# Patient Record
Sex: Female | Born: 1995 | Race: Black or African American | Hispanic: No | Marital: Single | Smoking: Never smoker
Health system: Southern US, Community
[De-identification: ages and names within clinical notes are randomized; demographics above are authoritative.]

---
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
## 2018-12-18 ENCOUNTER — Other Ambulatory Visit: Payer: Self-pay

## 2018-12-18 ENCOUNTER — Encounter: Payer: Self-pay | Admitting: Family Medicine

## 2018-12-18 ENCOUNTER — Ambulatory Visit (HOSPITAL_COMMUNITY)
Admission: RE | Admit: 2018-12-18 | Discharge: 2018-12-18 | Disposition: A | Discharge status: Home / Self Care | Payer: Managed Care, Other (non HMO) | Source: Ambulatory Visit | Attending: Family Medicine | Admitting: Family Medicine

## 2018-12-18 ENCOUNTER — Ambulatory Visit (INDEPENDENT_AMBULATORY_CARE_PROVIDER_SITE_OTHER): Payer: Managed Care, Other (non HMO) | Admitting: Family Medicine

## 2018-12-18 ENCOUNTER — Other Ambulatory Visit: Payer: Self-pay | Admitting: Family Medicine

## 2018-12-18 VITALS — BP 98/60 | Temp 97.5°F | Ht 66.0 in | Wt 133.0 lb

## 2018-12-18 DIAGNOSIS — M4120 Other idiopathic scoliosis, site unspecified: Secondary | ICD-10-CM | POA: Insufficient documentation

## 2018-12-18 DIAGNOSIS — M546 Pain in thoracic spine: Secondary | ICD-10-CM | POA: Insufficient documentation

## 2018-12-18 DIAGNOSIS — G8929 Other chronic pain: Secondary | ICD-10-CM

## 2018-12-18 DIAGNOSIS — Z23 Encounter for immunization: Secondary | ICD-10-CM | POA: Diagnosis not present

## 2018-12-18 DIAGNOSIS — Z0000 Encounter for general adult medical examination without abnormal findings: Secondary | ICD-10-CM | POA: Diagnosis not present

## 2018-12-18 DIAGNOSIS — Z114 Encounter for screening for human immunodeficiency virus [HIV]: Secondary | ICD-10-CM | POA: Diagnosis not present

## 2018-12-18 MED ORDER — IBUPROFEN 400 MG PO TABS: 400.0000 mg | ORAL_TABLET | Freq: Three times a day (TID) | ORAL | 0 refills | Status: DC | PRN

## 2018-12-18 NOTE — Patient Instructions (Signed)

## 2018-12-18 NOTE — Progress Notes (Addendum)
Patient ID: Regina Aguirre, female   DOB: [birth year], 22 y.o.   MRN: [id number] Subjective:     Regina BassetRafa H Chevalier is a 22 y.o. female and is here for a comprehensive physical exam. The patient reports problems - back pain x 2 months and left shoulder pain x 2 weeks.. Back Pain: mostly upper back, worse in the morning. Feels like it is related to school work. 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 No trauma or injury to her back or shoulder. Left shoulder pain: Started two weeks ago. Denies any injury. Denies heavy weight lifting.  LMP: 4th week in Nov. Regular.  Sex: Denies sexual activity.  Social History   Socioeconomic History  . Marital status: Single    Spouse name: Not on file  . Number of children: Not on file  . Years of education: Not on file  . Highest education level: Not on file  Occupational History  . Not on file  Social Needs  . Financial resource strain: Not on file  . Food insecurity:    Worry: Not on file    Inability: Not on file  . Transportation needs:    Medical: Not on file    Non-medical: Not on file  Tobacco Use  . Smoking status: Never Smoker  . Smokeless tobacco: Never Used  Substance and Sexual Activity  . Alcohol use: Not on file  . Drug use: Not on file  . Sexual activity: Not on file  Lifestyle  . Physical activity:    Days per week: Not on file    Minutes per session: Not on file  . Stress: Not on file  Relationships  . Social connections:    Talks on phone: Not on file    Gets together: Not on file    Attends religious service: Not on file    Active member of club or organization: Not on file    Attends meetings of clubs or organizations: Not on file    Relationship status: Not on file  . Intimate partner violence:    Fear of current or ex partner: Not on file    Emotionally abused: Not on file    Physically abused: Not on file    Forced sexual activity: Not on file  Other Topics  Concern  . Not on file  Social History Narrative   Will be going to [employer] 9th grade in the fall   Health Maintenance  Topic Date Due  . HIV Screening  07/25/2013  . INFLUENZA VACCINE  07/27/2018  . TETANUS/TDAP  08/09/2019    The following portions of the patient's history were reviewed and updated as appropriate: allergies, current medications, past family history, past medical history, past social history, past surgical history and problem list.  Review of Systems Pertinent items noted in HPI and remainder of comprehensive ROS otherwise negative.   Objective:    BP 98/60   Temp (!) 97.5 F (36.4 C) (Oral)   Ht 5\' 6"  (1.676 m)   Wt 133 lb (60.3 kg)   LMP  (LMP Unknown)   BMI 21.47 kg/m  General appearance: alert, cooperative and appears older than stated age Head: Normocephalic, without obvious abnormality, atraumatic Eyes: conjunctivae/corneas clear. PERRL, EOM's intact. Fundi benign. Ears: normal TM's and external ear canals both ears Throat: lips, mucosa, and tongue normal; teeth and gums normal Neck: no adenopathy, no carotid bruit, no JVD, supple, symmetrical, trachea midline  and thyroid not enlarged, symmetric, no tenderness/mass/nodules Back: Asymmetrical upper back. No tenderness Lungs: clear to auscultation bilaterally Heart: regular rate and rhythm, S1, S2 normal, no murmur, click, rub or gallop Abdomen: soft, non-tender; bowel sounds normal; no masses,  no organomegaly Extremities: extremities normal, atraumatic, no cyanosis or edema Pulses: 2+ and symmetric Skin: Skin color, texture, turgor normal. No rashes or lesions Lymph nodes: Cervical, supraclavicular, and axillary nodes normal. Neurologic: Alert and oriented X 3, normal strength and tone. Normal symmetric reflexes. Normal coordination and gait      [facility]  PHQ-9 Total Score  3     Assessment:    Healthy female exam. Back pain. Left  shoulder pain     Plan:    + Scoliosis on exam.  Vaccination updated. Not currently sexually active. Birth control not indicated. HIV screening completed. Xray ordered to assess severity of her scoliosis. Ibuprofen prescribed prn pain. F/U soon if symptoms worsen.

## 2018-12-18 NOTE — Progress Notes (Signed)
Patient ID: Regina Aguirre, female   DOB: 09/21/1996, 22 y.o.   MRN: [id number] Xray report discussed with her via phone call. She has midl scoliosis. I recommended back brace which she will obtain OTC. She requested Chiropractor referral. I don't think they require referral for this. She will call to schedule her own Chiro appointment.  Dg Thoracic Spine 2 View  Result Date: 12/18/2018 CLINICAL DATA:  Chronic back pain EXAM: THORACIC SPINE 2 VIEWS COMPARISON:  None. FINDINGS: Twelve rib pairs. 9 degree dextroscoliosis of the thoracic spine. No congenital vertebral anomalies. Normal vertebral body heights. IMPRESSION: No acute osseous abnormality.  Mild thoracic scoliosis Electronically Signed   By: Jasmine PangKim  Fujinaga M.D.   On: 12/18/2018 16:48   Dg Lumbar Spine Complete  Result Date: 12/18/2018 CLINICAL DATA:  Chronic back pain EXAM: LUMBAR SPINE - COMPLETE 4+ VIEW COMPARISON:  None. FINDINGS: Lumbar vertebra demonstrate normal stature. There is levoscoliosis of the thoracolumbar spine measuring approximately 17 degrees. No congenital vertebral anomalies. IMPRESSION: No acute osseous abnormality.  Scoliosis of the thoracolumbar spine Electronically Signed   By: Jasmine PangKim  Fujinaga M.D.   On: 12/18/2018 16:49

## 2018-12-19 LAB — HIV ANTIBODY (ROUTINE TESTING W REFLEX): HIV Screen 4th Generation wRfx: NONREACTIVE

## 2020-01-16 IMAGING — CR DG THORACIC SPINE 2V
3 series · 3 of 3 positions shown · non-contrast
Comparison: None.

CLINICAL DATA: Chronic back pain

EXAM:
THORACIC SPINE 2 VIEWS

[t-spine ap]
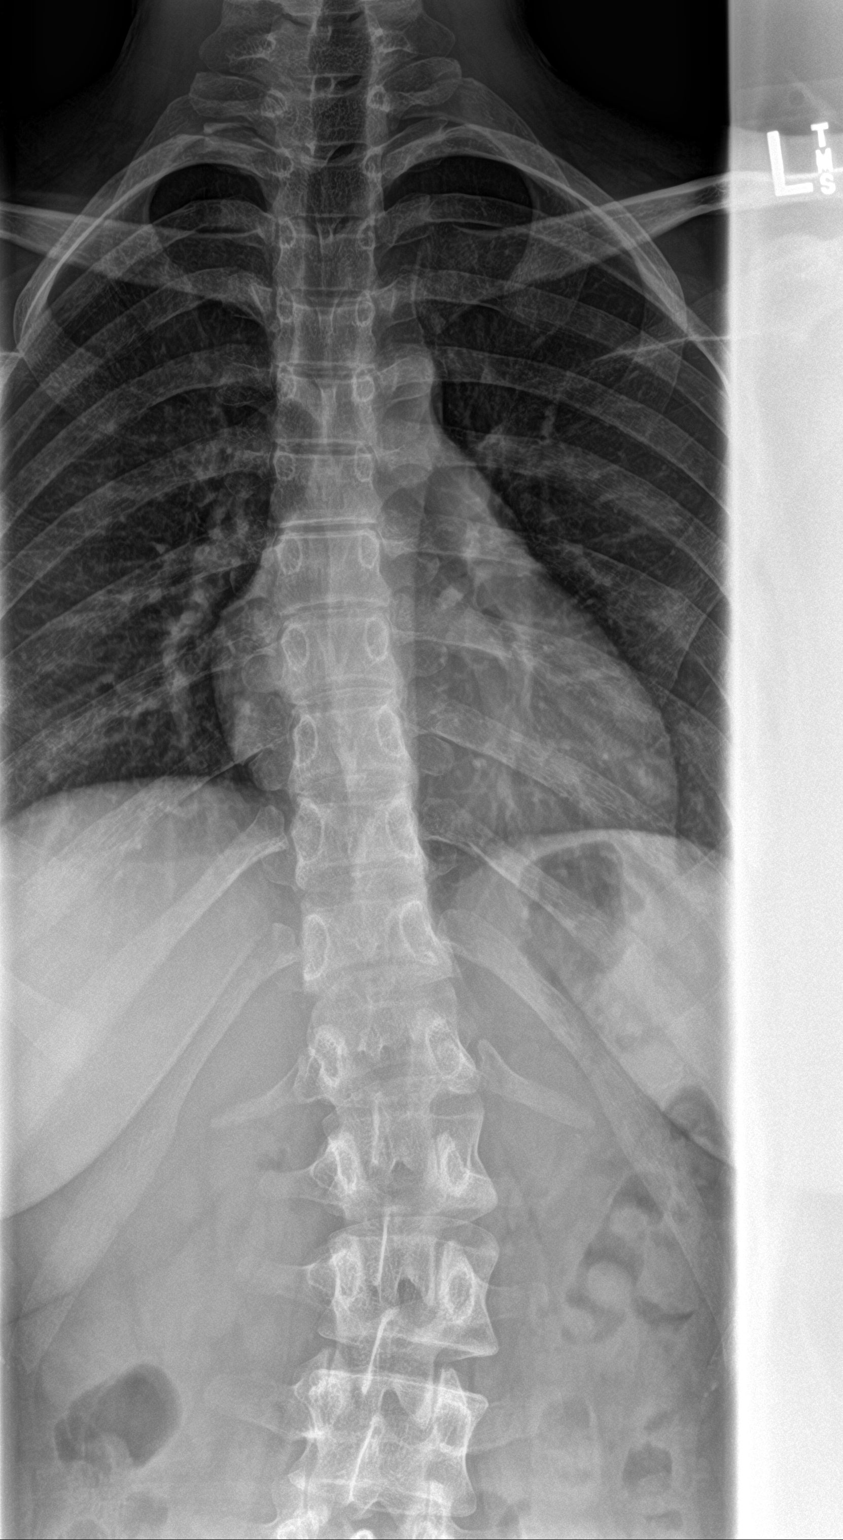

[t-spine lat]
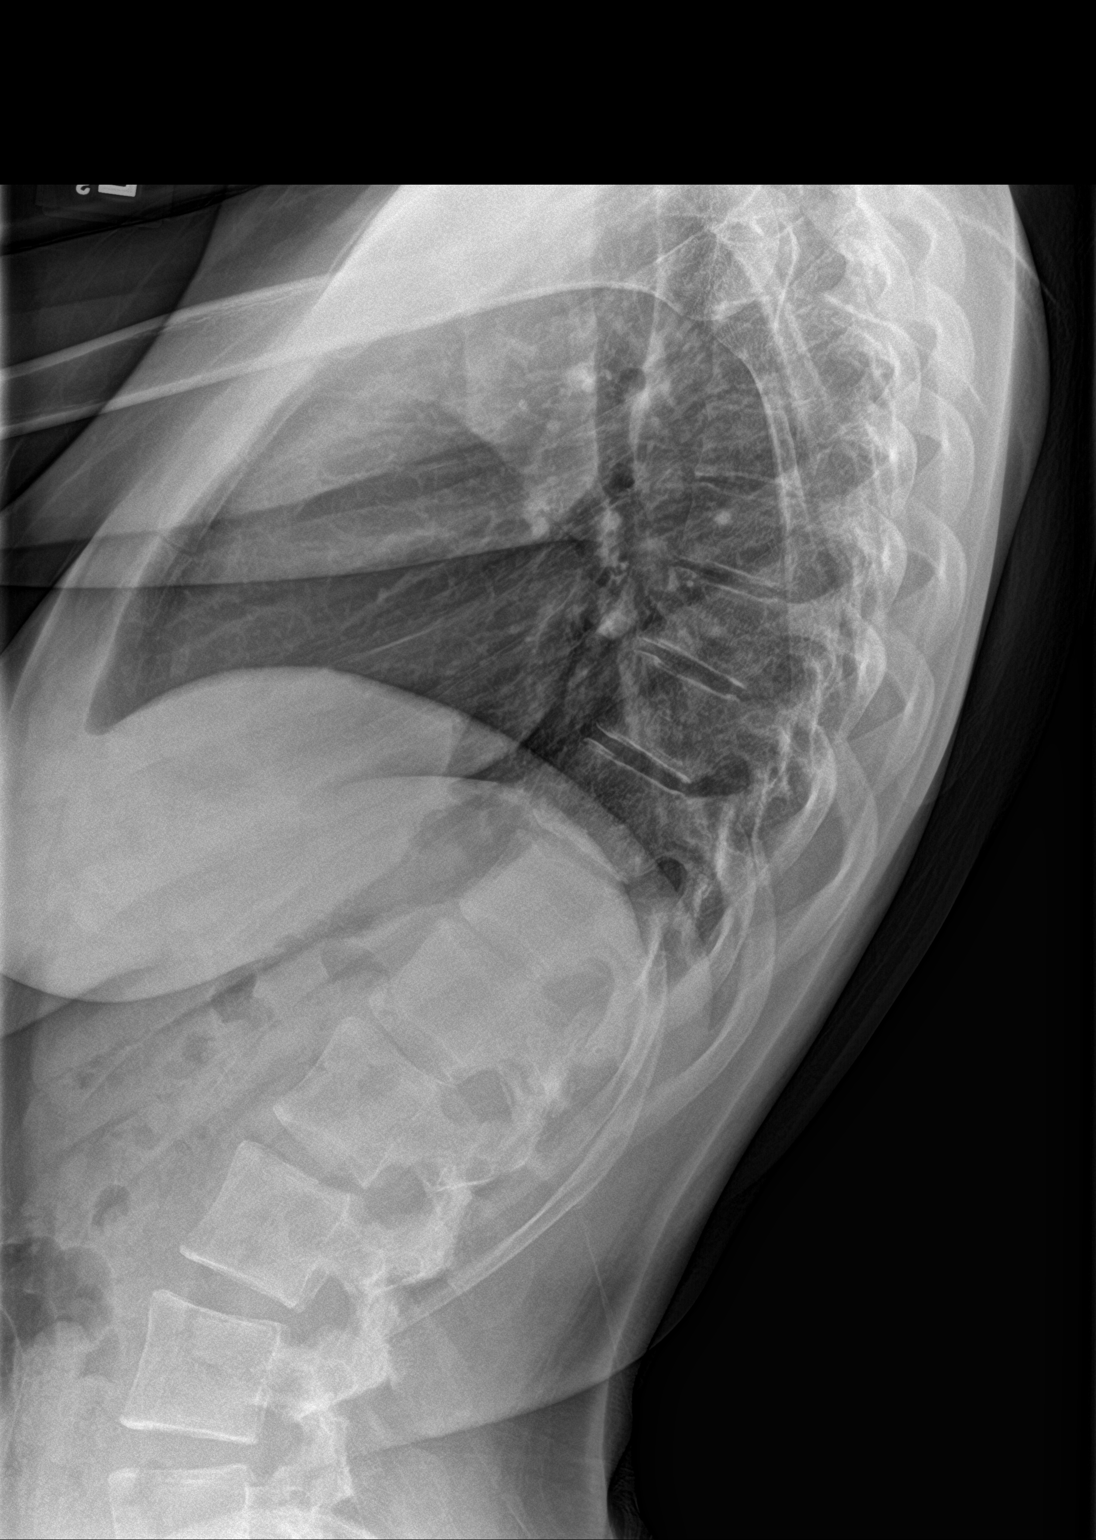

[t-spine swimmers]
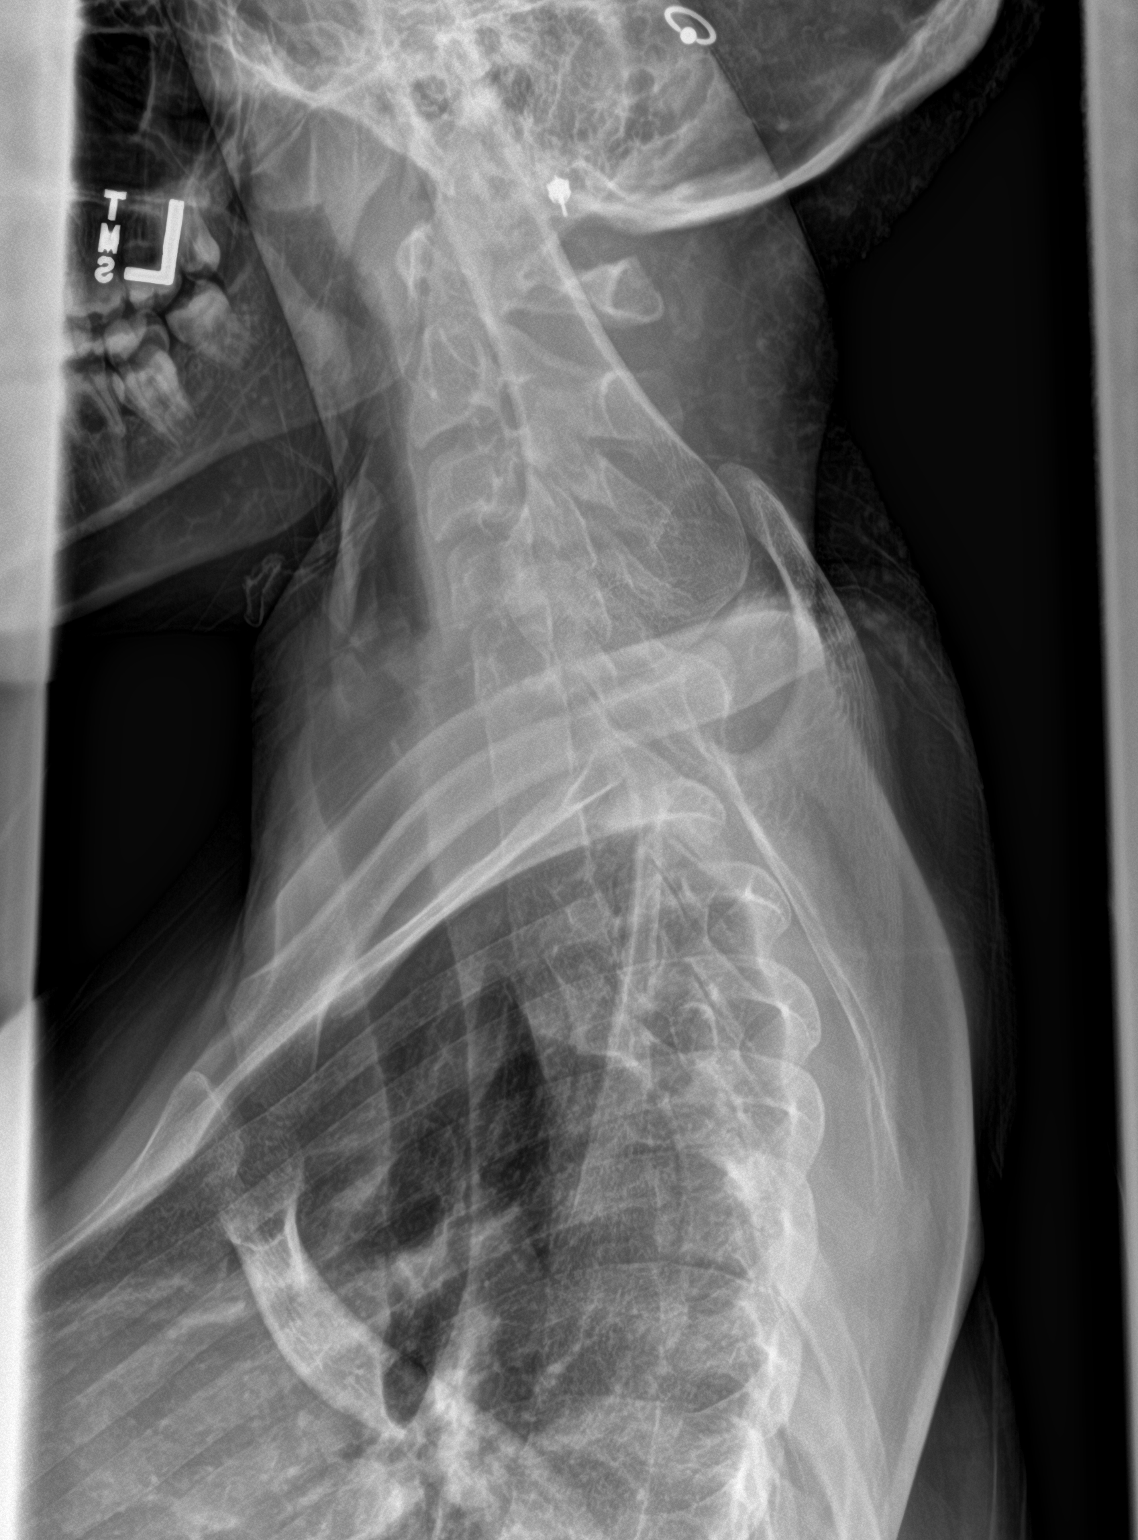

[3 of 3 positions shown; findings below may reference images not displayed]

FINDINGS: Twelve rib pairs. 9 degree dextroscoliosis of the thoracic spine. No
congenital vertebral anomalies. Normal vertebral body heights.
IMPRESSION: No acute osseous abnormality.  Mild thoracic scoliosis

## 2020-01-16 IMAGING — CR DG LUMBAR SPINE COMPLETE 4+V
5 series · 5 of 5 positions shown · non-contrast
Comparison: None.

CLINICAL DATA: Chronic back pain

EXAM:
LUMBAR SPINE - COMPLETE 4+ VIEW

[l-spine ap]
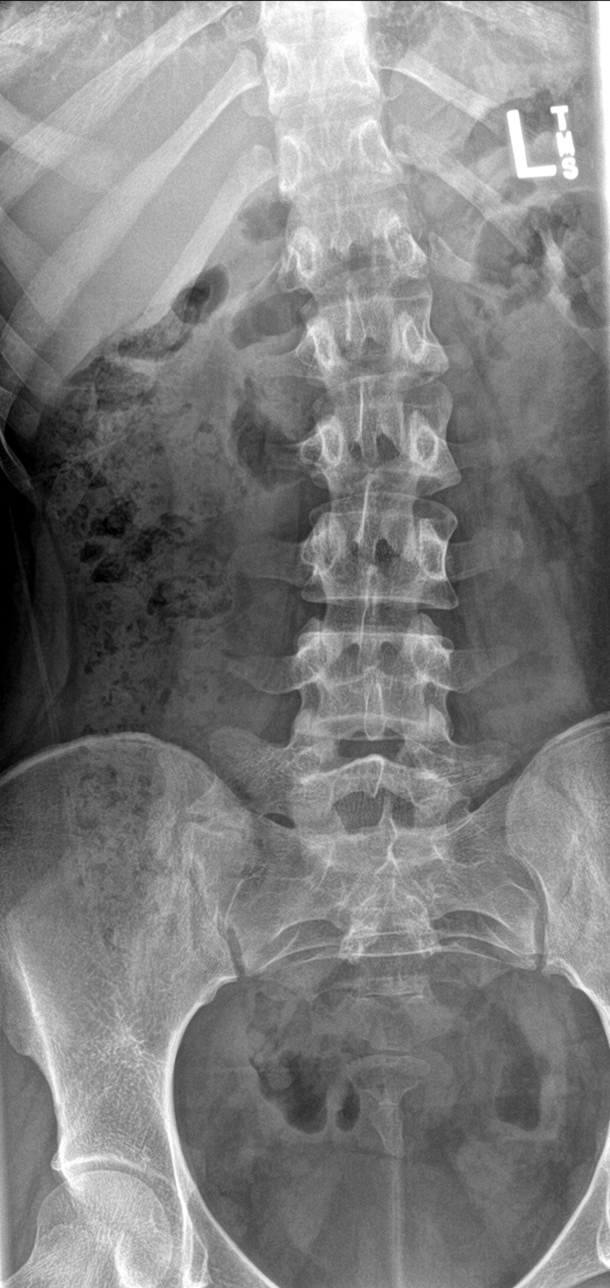

[l-spine obl (1 of 2)]
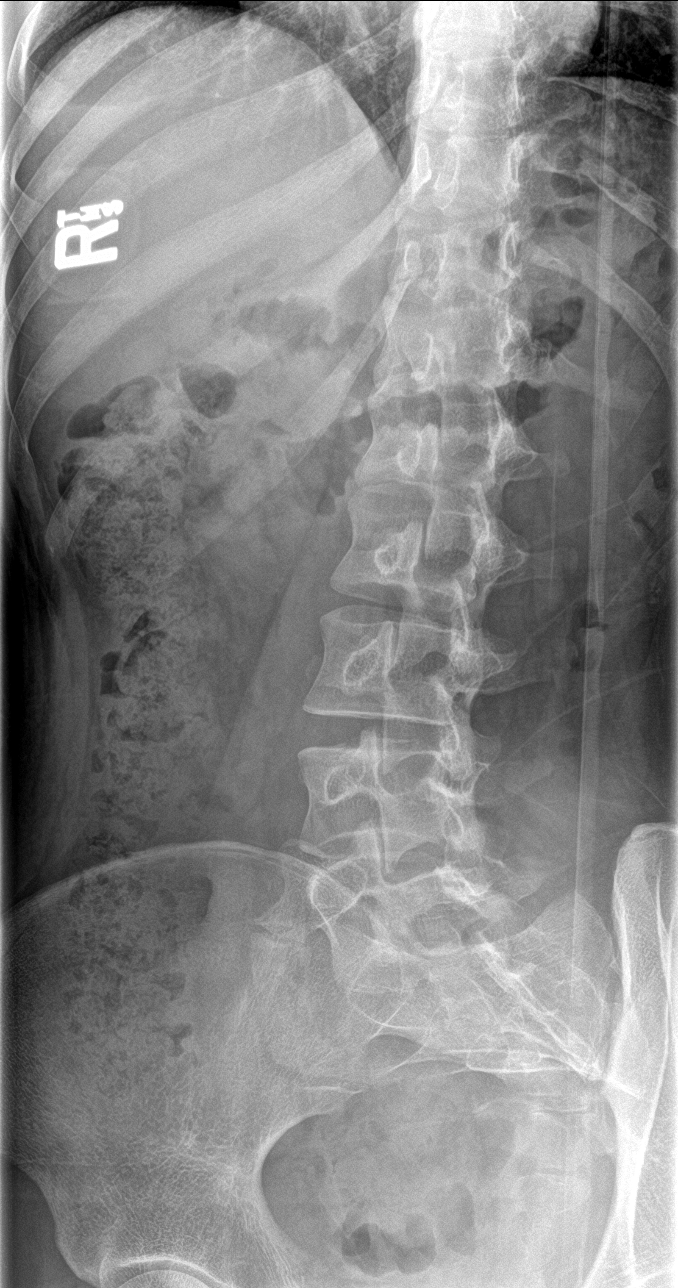

[l-spine obl (2 of 2)]
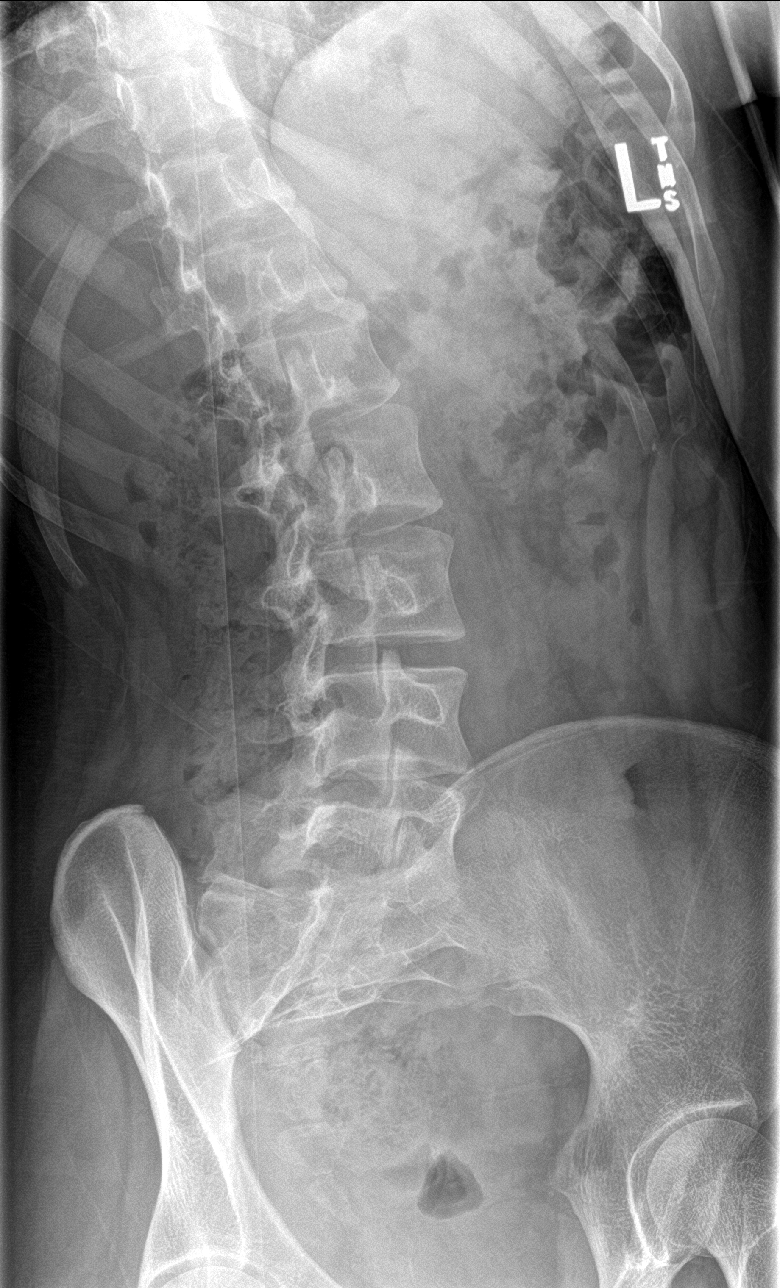

[l-spine lat]
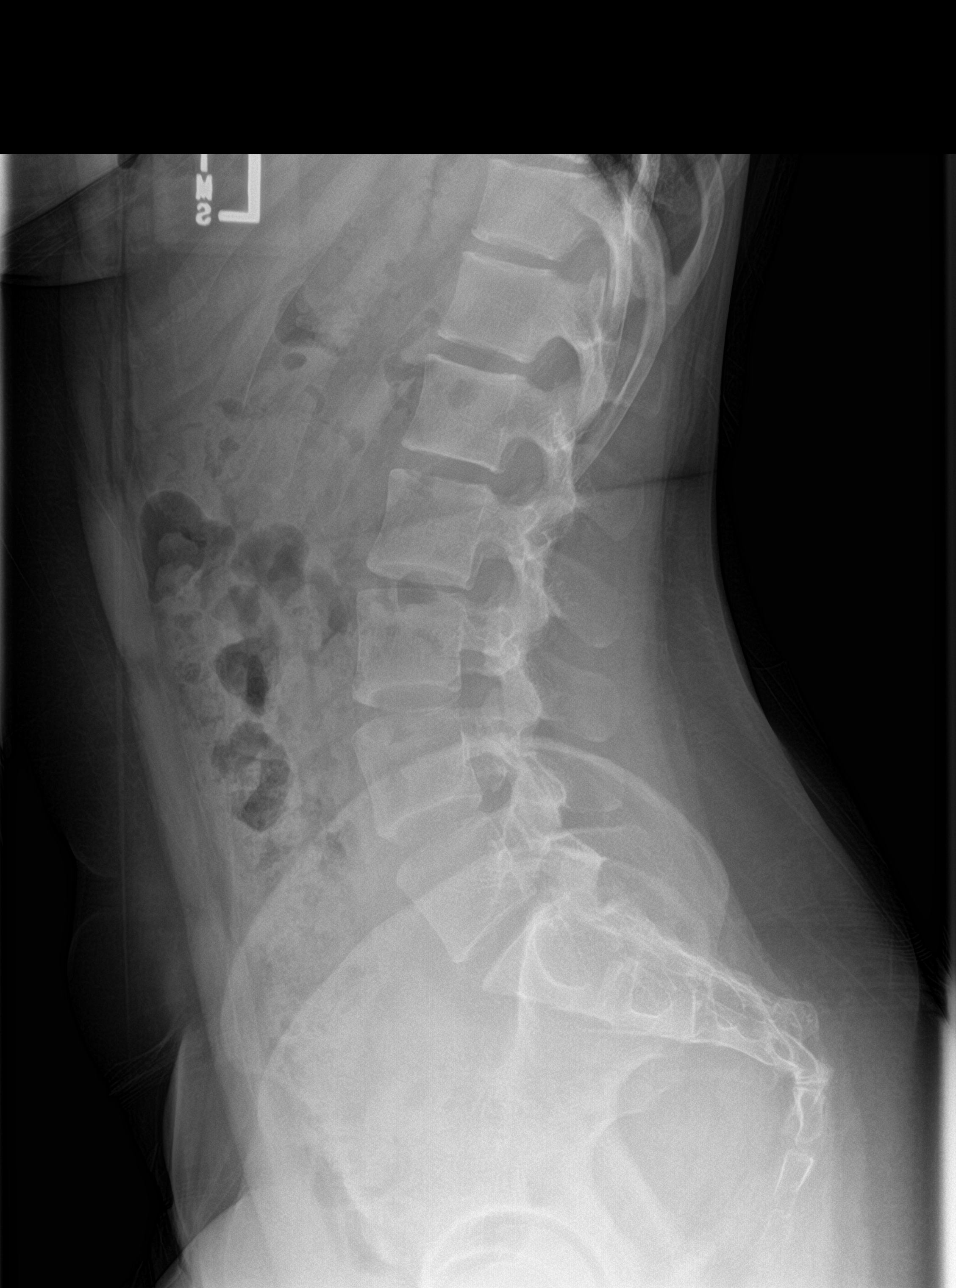

[l-spine spot]
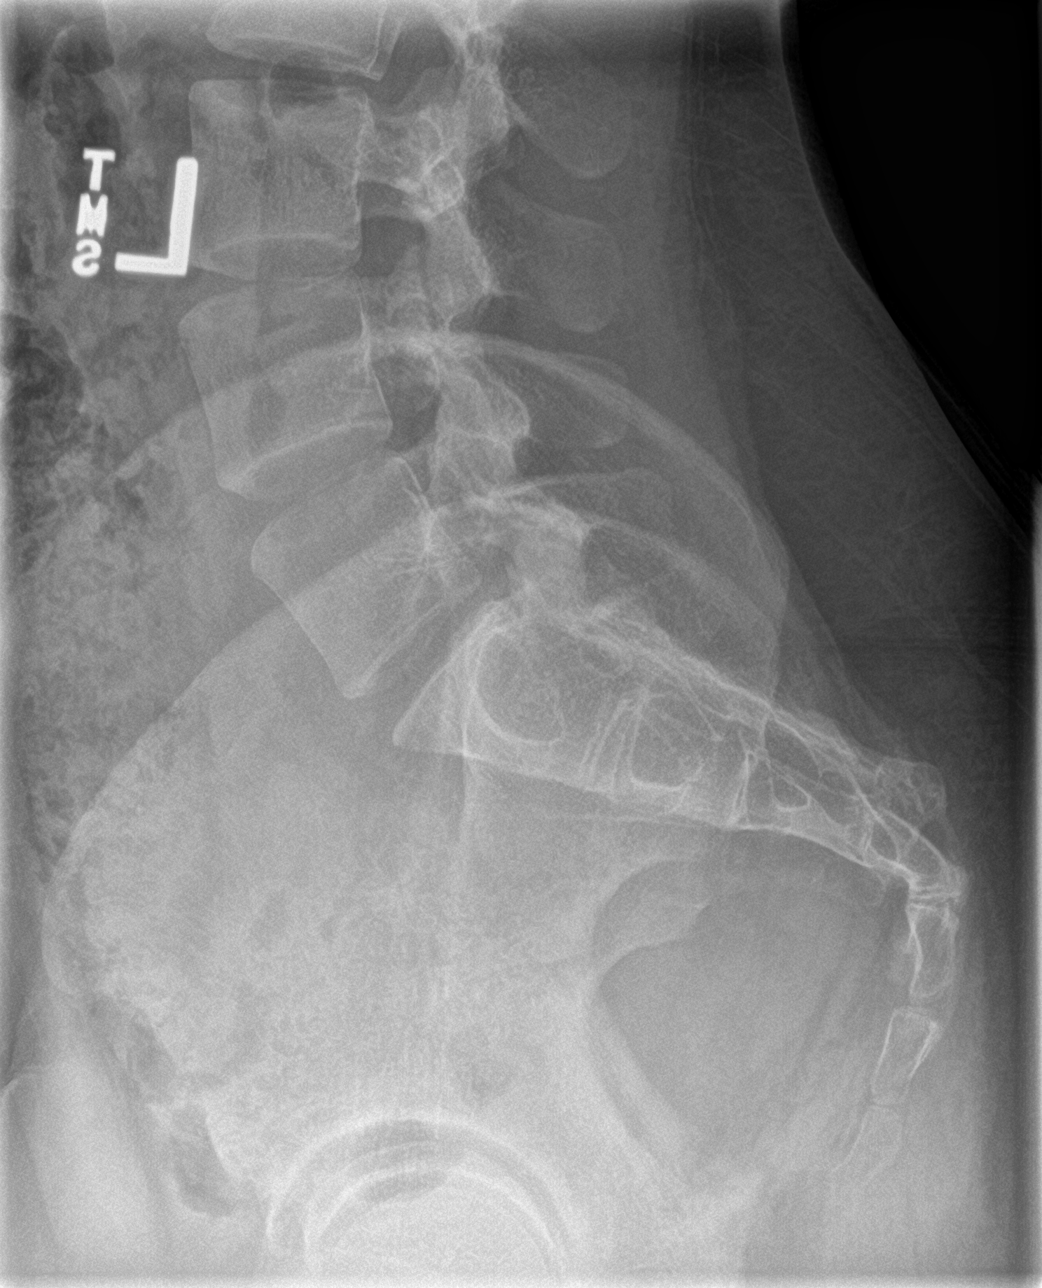

[5 of 5 positions shown; findings below may reference images not displayed]

FINDINGS: Lumbar vertebra demonstrate normal stature. There is levoscoliosis
of the thoracolumbar spine measuring approximately 17 degrees. No
congenital vertebral anomalies.
IMPRESSION: No acute osseous abnormality.  Scoliosis of the thoracolumbar spine

## 2020-03-16 ENCOUNTER — Other Ambulatory Visit: Payer: Self-pay

## 2020-03-16 ENCOUNTER — Emergency Department (HOSPITAL_COMMUNITY): Payer: Managed Care, Other (non HMO)

## 2020-03-16 ENCOUNTER — Telehealth: Payer: Self-pay | Admitting: Family Medicine

## 2020-03-16 ENCOUNTER — Emergency Department (HOSPITAL_COMMUNITY)
Admission: EM | Admit: 2020-03-16 | Discharge: 2020-03-16 | Disposition: A | Discharge status: Home / Self Care | Payer: Managed Care, Other (non HMO) | Attending: Emergency Medicine | Admitting: Emergency Medicine

## 2020-03-16 ENCOUNTER — Encounter (HOSPITAL_COMMUNITY): Payer: Self-pay | Admitting: Emergency Medicine

## 2020-03-16 DIAGNOSIS — R05 Cough: Secondary | ICD-10-CM | POA: Diagnosis present

## 2020-03-16 DIAGNOSIS — R42 Dizziness and giddiness: Secondary | ICD-10-CM | POA: Diagnosis not present

## 2020-03-16 DIAGNOSIS — U071 COVID-19: Secondary | ICD-10-CM | POA: Insufficient documentation

## 2020-03-16 DIAGNOSIS — Z79899 Other long term (current) drug therapy: Secondary | ICD-10-CM | POA: Insufficient documentation

## 2020-03-16 NOTE — Telephone Encounter (Signed)
**  After Hours/ Emergency Line Call**  Received a call from patient's mother to report that Regina Aguirre is COVID-19 positive and having low BPs.  Endorsing difficulty tolerating PO and "dropping blood pressures."  Mother reports that she has been trying to encourage her to drink, but patient is feeling very dizzy and her blood pressure is "80s/40s."  Advised ED evaluation and possible IV fluids.  Mother reports that she is able to drive her to the ED immediately.  Red flags discussed.  Will forward to PCP.  Luis Abed, DO PGY-2, [facility] 03/16/2020 3:48 PM

## 2020-03-16 NOTE — ED Notes (Signed)
Patient verbalizes understanding of discharge instructions. Opportunity for questioning and answers were provided. Armband removed by staff, pt discharged from ED.  

## 2020-03-16 NOTE — ED Provider Notes (Signed)
[facility], [place] EMERGENCY DEPARTMENT Provider Note   CSN: [id number] Arrival date & time: 03/16/20  1609     History Chief Complaint  Patient presents with  . Cough    Regina Aguirre is a 24 y.o. female.  The history is provided by the patient and medical records. No language interpreter was used.  Cough    24 year old female recently test positive for COVID-19 4 days ago presenting complaining of abnormal blood pressure.  Patient states her whole entire family is sick with Covid.  She has been experiencing lightheadedness and dizziness as well as occasional nonproductive cough, loss of taste, and generalized fatigue.  She mention that she has been drinking adequate amount of fluid and she has not been vomiting or having diarrhea.  She denies any shortness of breath he denies any fever but does endorse occasional chills.  She checked her blood pressure today and states that it was low in the 80s prompting this ER visit.  History reviewed. No pertinent past medical history.  There are no problems to display for this patient.   History reviewed. No pertinent surgical history.   OB History   No obstetric history on file.     Family History  Problem Relation Age of Onset  . Hypertension Mother   . Diabetes Maternal Grandfather   . Cancer Neg Hx     Social History   Tobacco Use  . Smoking status: Never Smoker  . Smokeless tobacco: Never Used  Substance Use Topics  . Alcohol use: Not on file  . Drug use: Not on file    Home Medications Prior to Admission medications   Medication Sig Start Date End Date Taking? Authorizing Provider  ibuprofen (ADVIL,MOTRIN) 400 MG tablet Take 1 tablet (400 mg total) by mouth every 8 (eight) hours as needed for moderate pain. 12/18/18   Doreene Eland, MD    Allergies    Patient has no known allergies.  Review of Systems   Review of Systems  Respiratory: Positive for cough.   All other systems reviewed and are  negative.   Physical Exam Updated Vital Signs BP 121/74 (BP Location: Left Arm)   Pulse 78   Temp 98.4 F (36.9 C) (Oral)   Resp 17   LMP 02/17/2020   SpO2 100%   Physical Exam Vitals and nursing note reviewed.  Constitutional:      General: She is not in acute distress.    Appearance: She is well-developed.     Comments: Patient is resting comfortably in the chair, speaking in complete sentences and in no acute discomfort.  HENT:     Head: Atraumatic.  Eyes:     Conjunctiva/sclera: Conjunctivae normal.  Cardiovascular:     Rate and Rhythm: Normal rate and regular rhythm.     Pulses: Normal pulses.     Heart sounds: Normal heart sounds.  Pulmonary:     Effort: Pulmonary effort is normal.     Breath sounds: Normal breath sounds. No wheezing, rhonchi or rales.  Abdominal:     Palpations: Abdomen is soft.     Tenderness: There is no abdominal tenderness.  Musculoskeletal:     Cervical back: Neck supple.  Skin:    Findings: No rash.  Neurological:     Mental Status: She is alert and oriented to person, place, and time.  Psychiatric:        Mood and Affect: Mood normal.     ED Results / Procedures / Treatments  Labs (all labs ordered are listed, but only abnormal results are displayed) Labs Reviewed - No data to display  EKG None  Radiology DG Chest Portable 1 View  Result Date: 03/16/2020 CLINICAL DATA:  Pt reports she tested +COVID on Wednesday. EXAM: PORTABLE CHEST 1 VIEW COMPARISON:  Chest radiograph 02/28/2006 FINDINGS: The heart size and mediastinal contours are within normal limits. The lungs are clear. No pneumothorax or significant pleural effusion. The visualized skeletal structures are unremarkable. IMPRESSION: Normal chest radiograph. Electronically Signed   By: Audie Pinto M.D.   On: 03/16/2020 17:33    Procedures Procedures (including critical care time)  Medications Ordered in ED Medications - No data to display  ED Course  I have  reviewed the triage vital signs and the nursing notes.  Pertinent labs & imaging results that were available during my care of the patient were reviewed by me and considered in my medical decision making (see chart for details).    MDM Rules/Calculators/A&P                      BP 121/74 (BP Location: Left Arm)   Pulse 78   Temp 98.4 F (36.9 C) (Oral)   Resp 17   LMP 02/17/2020 (Approximate) Comment: last period aprox 1 month ago. shielded  SpO2 100%   Final Clinical Impression(s) / ED Diagnoses Final diagnoses:  COVID-19 virus infection    Rx / DC Orders ED Discharge Orders    None     5:50 PM Patient recently test positive for COVID-19, presenting today due to concerns of low blood pressure and complaints of lightheadedness and dizziness.  She is well-appearing, vital signs stable, normal blood pressure, no fever, chest x-ray unremarkable, no shortness of breath.  At this time, patient is stable for discharge.  Reassurance given.  Return precaution discussed.  Regina Aguirre was evaluated in Emergency Department on 03/16/2020 for the symptoms described in the history of present illness. She was evaluated in the context of the global COVID-19 pandemic, which necessitated consideration that the patient might be at risk for infection with the SARS-CoV-2 virus that causes COVID-19. Institutional protocols and algorithms that pertain to the evaluation of patients at risk for COVID-19 are in a state of rapid change based on information released by regulatory bodies including the CDC and federal and state organizations. These policies and algorithms were followed during the patient's care in the ED.    Domenic Moras, PA-C 03/16/20 1752    Carmin Muskrat, MD 03/17/20 [phone]

## 2020-03-16 NOTE — ED Triage Notes (Signed)
Pt reports she tested +COVID on Wednesday. She states she has been checking her BP at home, last SBP 80. Denies other complaints at this time. Normotensive in triage.

## 2020-07-22 ENCOUNTER — Ambulatory Visit (HOSPITAL_COMMUNITY)
Admission: EM | Admit: 2020-07-22 | Discharge: 2020-07-22 | Disposition: A | Discharge status: Home / Self Care | Payer: Managed Care, Other (non HMO) | Attending: Urgent Care | Admitting: Urgent Care

## 2020-07-22 ENCOUNTER — Other Ambulatory Visit: Payer: Self-pay

## 2020-07-22 ENCOUNTER — Encounter (HOSPITAL_COMMUNITY): Payer: Self-pay

## 2020-07-22 DIAGNOSIS — M25551 Pain in right hip: Secondary | ICD-10-CM

## 2020-07-22 DIAGNOSIS — M79602 Pain in left arm: Secondary | ICD-10-CM

## 2020-07-22 DIAGNOSIS — M25552 Pain in left hip: Secondary | ICD-10-CM

## 2020-07-22 DIAGNOSIS — M79601 Pain in right arm: Secondary | ICD-10-CM

## 2020-07-22 DIAGNOSIS — S5012XA Contusion of left forearm, initial encounter: Secondary | ICD-10-CM

## 2020-07-22 MED ORDER — TIZANIDINE HCL 4 MG PO TABS: 4.0000 mg | ORAL_TABLET | Freq: Every day | ORAL | 0 refills | Status: DC

## 2020-07-22 MED ORDER — NAPROXEN 500 MG PO TABS: 500.0000 mg | ORAL_TABLET | Freq: Two times a day (BID) | ORAL | 0 refills | Status: DC
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
## 2021-12-16 ENCOUNTER — Telehealth: Payer: Self-pay

## 2021-12-16 ENCOUNTER — Ambulatory Visit: Payer: Managed Care, Other (non HMO) | Admitting: Family Medicine

## 2021-12-16 ENCOUNTER — Encounter: Payer: Self-pay | Admitting: Family Medicine

## 2021-12-16 ENCOUNTER — Other Ambulatory Visit: Payer: Self-pay

## 2021-12-16 ENCOUNTER — Other Ambulatory Visit: Payer: Self-pay | Admitting: Family Medicine

## 2021-12-16 VITALS — BP 106/62 | HR 82 | Ht 65.2 in | Wt 146.5 lb

## 2021-12-16 DIAGNOSIS — Z3009 Encounter for other general counseling and advice on contraception: Secondary | ICD-10-CM | POA: Diagnosis not present

## 2021-12-16 DIAGNOSIS — Z7184 Encounter for health counseling related to travel: Secondary | ICD-10-CM | POA: Diagnosis not present

## 2021-12-16 DIAGNOSIS — Z1159 Encounter for screening for other viral diseases: Secondary | ICD-10-CM

## 2021-12-16 DIAGNOSIS — Z23 Encounter for immunization: Secondary | ICD-10-CM

## 2021-12-16 MED ORDER — MEFLOQUINE HCL 250 MG PO TABS: 250.0000 mg | ORAL_TABLET | ORAL | 0 refills | Status: DC

## 2021-12-16 NOTE — Assessment & Plan Note (Signed)
She uses condoms regularly. OCP discussed and offered. She declined at this time.

## 2021-12-16 NOTE — Telephone Encounter (Signed)
Attempted to reach patient to come in for Hep C blood draw. The she left without doing today. No answer. LVM. Aquilla Solian, CMA

## 2021-12-16 NOTE — Patient Instructions (Addendum)
Malaria Prophylaxis: Start Mefloquine 2 weeks before travel. Take medication every week up till 4 weeks after arrival.  Eat and drink safely  Unclean food and water can cause travelers' diarrhea and other diseases. Reduce your risk by sticking to safe food and water habits.   Eat  Food that is cooked and served hot  Hard-cooked eggs  Fruits and vegetables you have washed in clean water or peeled yourself  Pasteurized dairy products   Don't Eat  Food served at room temperature  Food from street vendors  Raw or soft-cooked (runny) eggs  Raw or undercooked (rare) meat or fish  Unwashed or unpeeled raw fruits and vegetables  Unpasteurized dairy products  "Bushmeat" (monkeys, bats, or other wild game)   Drink  Bottled water that is sealed  Water that has been disinfected  Ice made with bottled or disinfected water  Carbonated drinks  Hot coffee or tea  Pasteurized milk   Don't Drink  Tap or well water  Ice made with tap or well water  Drinks made with tap or well water (such as reconstituted juice)  Unpasteurized milk  Take Medicine  Talk with your doctor about taking prescription or over-the-counter drugs with you on your trip in case you get sick.   Prevent bug bites  Bugs (like mosquitoes, ticks, and fleas) can spread a number of diseases in [place]. Many of these diseases cannot be prevented with a vaccine or medicine. You can reduce your risk by taking steps to prevent bug bites.   What can I do to prevent bug bites?  Cover exposed skin by wearing long-sleeved shirts, long pants, and hats.  Use an appropriate insect repellent (see below).  Use permethrin-treated clothing and gear (such as boots, pants, socks, and tents). Do not use permethrin directly on skin.  Stay and sleep in air-conditioned or screened rooms.  Use a bed net if the area where you are sleeping is exposed to the outdoors.  What type of insect repellent should I use?  FOR PROTECTION AGAINST TICKS AND  MOSQUITOES: Use a repellent that contains 20% or more DEET for protection that lasts up to several hours.  FOR PROTECTION AGAINST MOSQUITOES ONLY: Products with one of the following active ingredients can also help prevent mosquito bites. Higher percentages of active ingredient provide longer protection.  DEET  Picaridin (also known as KBR 3023, Bayrepel, and icaridin)  Oil of lemon eucalyptus (OLE) or para-menthane-diol (PMD)  IR3535  2-undecanone  Always use insect repellent as directed.    Keep away from animals  Most animals avoid people, but they may attack if they feel threatened, are protecting their young or territory, or if they are injured or ill. Animal bites and scratches can lead to serious diseases such as rabies.   Follow these tips to protect yourself:   Do not touch or feed any animals you do not know.  Do not allow animals to lick open wounds, and do not get animal saliva in your eyes or mouth.  Avoid rodents and their urine and feces.  Traveling pets should be supervised closely and not allowed to come in contact with local animals.  If you wake in a room with a bat, seek medical care immediately. Bat bites may be hard to see.  All animals can pose a threat, but be extra careful around dogs, bats, monkeys, sea animals such as jellyfish, and snakes. If you are bitten or scratched by an animal, immediately:   Wash the wound with  soap and clean water.  Go to a doctor right away.  Tell your doctor about your injury when you get back to the [place].  Consider buying medical evacuation insurance. Rabies is a deadly disease that must be treated quickly, and treatment may not be available in some countries.

## 2021-12-16 NOTE — Progress Notes (Signed)
° ° °  SUBJECTIVE:   CHIEF COMPLAINT / HPI:   Travel encounter: The patient will travel to [place], [place], [place], and [place] from January 19th to March 3rd. She spends about two weeks in [place] and a few days in each of the other countries listed. She denies any concerns today.   HM: She is due for PAP and Hep C screening. She had two doses of COVID 19 vaccination and does not want Flu or COVID booster today.  Contraceptives: She is sexually active with same partner of 2-3 yrs. She uses condoms regularly.  PERTINENT  PMH / PSH: PMH reviewed.  OBJECTIVE:   BP 106/62    Pulse 82    Ht 5' 5.2" (1.656 m)    Wt 146 lb 8 oz (66.5 kg)    LMP 12/12/2021    SpO2 100%    BMI 24.23 kg/m   Physical Exam Vitals and nursing note reviewed.  Cardiovascular:     Rate and Rhythm: Normal rate and regular rhythm.     Heart sounds: Normal heart sounds. No murmur heard. Pulmonary:     Effort: No respiratory distress.     Breath sounds: Normal breath sounds. No wheezing.   Flowsheet Row Office Visit from 12/16/2021 in [facility]  PHQ-9 Total Score 0        ASSESSMENT/PLAN:   Contraceptive use education Travel advisory counseling provided (food, water, hand hygiene, face masking, e.t.c) Vaccination record reviewed. Flu shot and COVID-19 booster were recommended, but she declined. I discussed Yellow fever and Typhoid vaccination if going to an endemic area.  She will need to follow up at the HD for these vaccinations. Malaria prophylaxis with Mefloquine was discussed and escribed. F/U as needed.  Contraceptive education She uses condoms regularly. OCP discussed and offered. She declined at this time.   Health maintenance: PAP discussed. She will schedule ana appointment for this. Hep C screening offered and she agreed on this. Unfortunately, lab was not drawn before she left. I have reached out to CMA to help her schedule a lab appointment. Future order  placed.  Janit Pagan, MD [facility]

## 2021-12-16 NOTE — Assessment & Plan Note (Signed)
Travel advisory counseling provided (food, water, hand hygiene, face masking, e.t.c) Vaccination record reviewed. Flu shot and COVID-19 booster were recommended, but she declined. I discussed Yellow fever and Typhoid vaccination if going to an endemic area.  She will need to follow up at the HD for these vaccinations. Malaria prophylaxis with Mefloquine was discussed and escribed. F/U as needed.

## 2021-12-25 NOTE — Telephone Encounter (Signed)
2nd attempt to reach patient to come in for Hep C blood draw. No answer. LVM for patient to call the office to make that appt. Aquilla Solian, CMA

## 2022-09-24 ENCOUNTER — Encounter: Payer: Self-pay | Admitting: Family Medicine

## 2022-09-24 ENCOUNTER — Ambulatory Visit: Payer: Managed Care, Other (non HMO) | Admitting: Family Medicine

## 2022-09-24 DIAGNOSIS — G43909 Migraine, unspecified, not intractable, without status migrainosus: Secondary | ICD-10-CM

## 2022-09-24 MED ORDER — MAGNESIUM OXIDE 400 MG PO CAPS: 1.0000 | ORAL_CAPSULE | Freq: Every day | ORAL | 99 refills | Status: DC | PRN

## 2022-09-24 MED ORDER — IBUPROFEN 400 MG PO TABS: 400.0000 mg | ORAL_TABLET | Freq: Three times a day (TID) | ORAL | 1 refills | Status: DC | PRN

## 2022-09-24 NOTE — Patient Instructions (Signed)
Migraine Headache ?A migraine headache is a very strong throbbing pain on one side or both sides of your head. This type of headache can also cause other symptoms. It can last from 4 hours to 3 days. Talk with your doctor about what things may bring on (trigger) this condition. ?What are the causes? ?The exact cause of this condition is not known. This condition may be triggered or caused by: ?Drinking alcohol. ?Smoking. ?Taking medicines, such as: ?Medicine used to treat chest pain (nitroglycerin). ?Birth control pills. ?Estrogen. ?Some blood pressure medicines. ?Eating or drinking certain products. ?Doing physical activity. ?Other things that may trigger a migraine headache include: ?Having a menstrual period. ?Pregnancy. ?Hunger. ?Stress. ?Not getting enough sleep or getting too much sleep. ?Weather changes. ?Tiredness (fatigue). ?What increases the risk? ?Being 25-55 years old. ?Being female. ?Having a family history of migraine headaches. ?Being Caucasian. ?Having depression or anxiety. ?Being very overweight. ?What are the signs or symptoms? ?A throbbing pain. This pain may: ?Happen in any area of the head, such as on one side or both sides. ?Make it hard to do daily activities. ?Get worse with physical activity. ?Get worse around bright lights or loud noises. ?Other symptoms may include: ?Feeling sick to your stomach (nauseous). ?Vomiting. ?Dizziness. ?Being sensitive to bright lights, loud noises, or smells. ?Before you get a migraine headache, you may get warning signs (an aura). An aura may include: ?Seeing flashing lights or having blind spots. ?Seeing bright spots, halos, or zigzag lines. ?Having tunnel vision or blurred vision. ?Having numbness or a tingling feeling. ?Having trouble talking. ?Having weak muscles. ?Some people have symptoms after a migraine headache (postdromal phase), such as: ?Tiredness. ?Trouble thinking (concentrating). ?How is this treated? ?Taking medicines that: ?Relieve  pain. ?Relieve the feeling of being sick to your stomach. ?Prevent migraine headaches. ?Treatment may also include: ?Having acupuncture. ?Avoiding foods that bring on migraine headaches. ?Learning ways to control your body functions (biofeedback). ?Therapy to help you know and deal with negative thoughts (cognitive behavioral therapy). ?Follow these instructions at home: ?Medicines ?Take over-the-counter and prescription medicines only as told by your doctor. ?Ask your doctor if the medicine prescribed to you: ?Requires you to avoid driving or using heavy machinery. ?Can cause trouble pooping (constipation). You may need to take these steps to prevent or treat trouble pooping: ?Drink enough fluid to keep your pee (urine) pale yellow. ?Take over-the-counter or prescription medicines. ?Eat foods that are high in fiber. These include beans, whole grains, and fresh fruits and vegetables. ?Limit foods that are high in fat and sugar. These include fried or sweet foods. ?Lifestyle ?Do not drink alcohol. ?Do not use any products that contain nicotine or tobacco, such as cigarettes, e-cigarettes, and chewing tobacco. If you need help quitting, ask your doctor. ?Get at least 8 hours of sleep every night. ?Limit and deal with stress. ?General instructions ? ?  ? ?Keep a journal to find out what may bring on your migraine headaches. For example, write down: ?What you eat and drink. ?How much sleep you get. ?Any change in what you eat or drink. ?Any change in your medicines. ?If you have a migraine headache: ?Avoid things that make your symptoms worse, such as bright lights. ?It may help to lie down in a dark, quiet room. ?Do not drive or use heavy machinery. ?Ask your doctor what activities are safe for you. ?Keep all follow-up visits as told by your doctor. This is important. ?Contact a doctor if: ?You get a migraine   headache that is different or worse than others you have had. ?You have more than 15 headache days in one  month. ?Get help right away if: ?Your migraine headache gets very bad. ?Your migraine headache lasts longer than 72 hours. ?You have a fever. ?You have a stiff neck. ?You have trouble seeing. ?Your muscles feel weak or like you cannot control them. ?You start to lose your balance a lot. ?You start to have trouble walking. ?You pass out (faint). ?You have a seizure. ?Summary ?A migraine headache is a very strong throbbing pain on one side or both sides of your head. These headaches can also cause other symptoms. ?This condition may be treated with medicines and changes to your lifestyle. ?Keep a journal to find out what may bring on your migraine headaches. ?Contact a doctor if you get a migraine headache that is different or worse than others you have had. ?Contact your doctor if you have more than 15 headache days in a month. ?This information is not intended to replace advice given to you by your health care provider. Make sure you discuss any questions you have with your health care provider. ?Document Revised: 04/06/2019 Document Reviewed: 01/25/2019 ?Elsevier Patient Education ? 2023 [employer] ? ?

## 2022-09-24 NOTE — Assessment & Plan Note (Signed)
Currently asymptomatic and no signs of meningeal irritation. Trial of Ibuprofen 400 mg prn as well as Magnesium oxide discussed. Consider adding Imitrex in the future if there is no improvement. She agreed with the plan.

## 2022-09-24 NOTE — Progress Notes (Signed)
    SUBJECTIVE:   CHIEF COMPLAINT / HPI:   Migraine  This is a new problem. The problem has been unchanged. The pain is located in the Bilateral, occipital and temporal region. The pain radiates to the left neck and right neck. The quality of the pain is described as pulsating, sharp and throbbing. The pain is at a severity of 0/10 (Patient denies pain now. However, her pain can go up to 10/10  in severity sometimes). Associated symptoms include phonophobia and photophobia. Pertinent negatives include no dizziness, eye redness, eye watering, numbness, visual change or vomiting. Associated symptoms comments: Eye soreness whenever she has migraine. Exacerbated by: Movement. Treatments tried: Advil and rest in a dark room. The treatment provided moderate relief. Her past medical history is significant for migraine headaches.     PERTINENT  PMH / PSH: PMHX reviewed  OBJECTIVE:   BP 109/69   Pulse 74   Ht 5\' 5"  (1.651 m)   Wt 141 lb 3.2 oz (64 kg)   LMP 09/10/2022   SpO2 100%   BMI 23.50 kg/m   Physical Exam Vitals and nursing note reviewed.  Eyes:     Extraocular Movements: Extraocular movements intact.     Pupils: Pupils are equal, round, and reactive to light.  Cardiovascular:     Rate and Rhythm: Normal rate and regular rhythm.     Heart sounds: Normal heart sounds. No murmur heard. Pulmonary:     Effort: Pulmonary effort is normal. No respiratory distress.     Breath sounds: Normal breath sounds. No wheezing.  Musculoskeletal:     Cervical back: Normal range of motion and neck supple.  Neurological:     General: No focal deficit present.     Mental Status: She is alert and oriented to person, place, and time.     Cranial Nerves: Cranial nerves 2-12 are intact.     Sensory: Sensation is intact.     Motor: Motor function is intact.     Coordination: Coordination is intact.     Deep Tendon Reflexes: Reflexes are normal and symmetric.      ASSESSMENT/PLAN:    Migraine Currently asymptomatic and no signs of meningeal irritation. Trial of Ibuprofen 400 mg prn as well as Magnesium oxide discussed. Consider adding Imitrex in the future if there is no improvement. She agreed with the plan.   She will return for PAP, Flu shot and Hep C screening.   Andrena Mews, MD [place]

## 2022-10-15 ENCOUNTER — Ambulatory Visit: Payer: Managed Care, Other (non HMO) | Admitting: Family Medicine

## 2023-07-04 NOTE — Telephone Encounter (Signed)
Attempted to reach patient. No answer. LVM for patient to call the office to make appt for physical. Aquilla Solian, CMA

## 2024-01-06 ENCOUNTER — Ambulatory Visit (INDEPENDENT_AMBULATORY_CARE_PROVIDER_SITE_OTHER): Payer: Managed Care, Other (non HMO) | Admitting: Family Medicine

## 2024-01-06 ENCOUNTER — Ambulatory Visit (HOSPITAL_COMMUNITY)
Admission: RE | Admit: 2024-01-06 | Discharge: 2024-01-06 | Disposition: A | Discharge status: Home / Self Care | Payer: Self-pay | Source: Ambulatory Visit | Attending: Family Medicine | Admitting: Family Medicine

## 2024-01-06 ENCOUNTER — Encounter: Payer: Self-pay | Admitting: Family Medicine

## 2024-01-06 VITALS — BP 112/65 | HR 96 | Ht 65.0 in | Wt 125.0 lb

## 2024-01-06 DIAGNOSIS — S99922A Unspecified injury of left foot, initial encounter: Secondary | ICD-10-CM | POA: Insufficient documentation

## 2024-01-06 DIAGNOSIS — S99929A Unspecified injury of unspecified foot, initial encounter: Secondary | ICD-10-CM | POA: Insufficient documentation

## 2024-01-06 DIAGNOSIS — R634 Abnormal weight loss: Secondary | ICD-10-CM | POA: Insufficient documentation

## 2024-01-06 MED ORDER — IBUPROFEN 400 MG PO TABS: 400.0000 mg | ORAL_TABLET | Freq: Three times a day (TID) | ORAL | 0 refills | Status: AC | PRN
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
## 2024-01-06 NOTE — Patient Instructions (Signed)
 It was nice seeing you today. Please go to the [facility] radiology department fr your foot xray. I will contact you with the results soon. Use Ibuprofen as needed for pain.

## 2024-01-06 NOTE — Assessment & Plan Note (Addendum)
 Intentional - been walking a lot with reduced meal intake She is down 11% from her previous weight I offered to discuss this with her with mom out of the room to discuss further evaluation and management However, she declined She stated now that she is back in the [place], she can get back to her normal diet We will monitor closely for now

## 2024-01-08 ENCOUNTER — Encounter: Payer: Self-pay | Admitting: Family Medicine

## 2024-01-09 ENCOUNTER — Telehealth: Payer: Self-pay

## 2024-01-09 NOTE — Telephone Encounter (Signed)
 Call Sports Med. About message from provider. Rep for Sports Med stated that she will call the patient and make appt. Aquilla Solian, CMA

## 2024-01-09 NOTE — Telephone Encounter (Signed)
 Patient's mother presents to clinic regarding ankle brace.   Patient was seen in the office on 01/06/24 and was instructed to follow up with Sports Medicine.   She has an appointment with Dr. Teressa tomorrow at 8:30.  Unable to find orders in chart or documentation regarding brace. Per insurance, we would need office visit documentation regarding need for brace.   Went up front to attempt to discuss situation further with patient and mother. They were no longer in the building.   Dr. Anders- should patient just follow up with Sports Medicine for brace?   Please advise.   Chiquita JAYSON English, RN

## 2024-01-10 ENCOUNTER — Encounter: Payer: Self-pay | Admitting: Family Medicine

## 2024-01-10 ENCOUNTER — Ambulatory Visit: Payer: 59 | Admitting: Family Medicine

## 2024-01-10 VITALS — BP 96/64 | Ht 66.0 in | Wt 130.0 lb

## 2024-01-10 DIAGNOSIS — M79672 Pain in left foot: Secondary | ICD-10-CM
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
## 2024-01-11 ENCOUNTER — Other Ambulatory Visit: Payer: Self-pay

## 2024-01-13 ENCOUNTER — Encounter: Payer: Self-pay | Admitting: Family Medicine

## 2024-01-17 ENCOUNTER — Other Ambulatory Visit: Payer: Self-pay

## 2024-01-17 ENCOUNTER — Encounter: Payer: Self-pay | Admitting: Family Medicine

## 2024-01-18 ENCOUNTER — Inpatient Hospital Stay: Admission: RE | Admit: 2024-01-18 | Payer: Self-pay | Source: Ambulatory Visit

## 2024-01-19 ENCOUNTER — Encounter: Payer: Self-pay | Admitting: Family Medicine

## 2024-01-19 ENCOUNTER — Ambulatory Visit
Admission: RE | Admit: 2024-01-19 | Discharge: 2024-01-19 | Disposition: A | Discharge status: Home / Self Care | Payer: No Typology Code available for payment source | Source: Ambulatory Visit | Attending: Family Medicine | Admitting: Family Medicine

## 2024-01-19 DIAGNOSIS — M79672 Pain in left foot: Secondary | ICD-10-CM

## 2024-01-19 DIAGNOSIS — S92335A Nondisplaced fracture of third metatarsal bone, left foot, initial encounter for closed fracture: Secondary | ICD-10-CM | POA: Insufficient documentation

## 2024-01-19 DIAGNOSIS — S92345A Nondisplaced fracture of fourth metatarsal bone, left foot, initial encounter for closed fracture: Secondary | ICD-10-CM | POA: Insufficient documentation
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
## 2024-01-20 ENCOUNTER — Encounter: Payer: Self-pay | Admitting: Family Medicine

## 2024-01-24 ENCOUNTER — Ambulatory Visit (INDEPENDENT_AMBULATORY_CARE_PROVIDER_SITE_OTHER): Payer: 59 | Admitting: Family Medicine

## 2024-01-24 VITALS — BP 110/57 | Ht 66.0 in | Wt 130.0 lb

## 2024-01-24 DIAGNOSIS — S92345D Nondisplaced fracture of fourth metatarsal bone, left foot, subsequent encounter for fracture with routine healing: Secondary | ICD-10-CM | POA: Diagnosis not present

## 2024-01-24 DIAGNOSIS — S92335D Nondisplaced fracture of third metatarsal bone, left foot, subsequent encounter for fracture with routine healing: Secondary | ICD-10-CM
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
## 2024-01-25 ENCOUNTER — Encounter: Payer: Self-pay | Admitting: Family Medicine

## 2024-01-25 NOTE — Patient Instructions (Signed)
You have fractures of your third and fourth metatarsals as discussed, along with bruising of several of the bones in your foot. -You should use the crutches and be nonweightbearing, while also using the cam boot, over the next 2 weeks -You should start taking calcium 1200 mg daily plus vitamin D 800 international units daily.  You can take these individually, or there are over-the-counter options that have a combination of both.  Common formulations are Os-Cal and Caltrate that you can purchase at your local pharmacy or online -You should continue to ice as needed -Can continue ibuprofen as needed, up to 800 mg 3 times a day with food -Follow-up with me in 2 weeks -Do not hesitate to reach out with any questions or concerns
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
## 2024-02-07 ENCOUNTER — Ambulatory Visit (INDEPENDENT_AMBULATORY_CARE_PROVIDER_SITE_OTHER): Payer: 59 | Admitting: Family Medicine

## 2024-02-07 ENCOUNTER — Encounter: Payer: Self-pay | Admitting: Family Medicine

## 2024-02-07 ENCOUNTER — Other Ambulatory Visit: Payer: Self-pay

## 2024-02-07 VITALS — BP 108/62 | Ht 66.0 in | Wt 130.0 lb

## 2024-02-07 DIAGNOSIS — S92345D Nondisplaced fracture of fourth metatarsal bone, left foot, subsequent encounter for fracture with routine healing: Secondary | ICD-10-CM

## 2024-02-07 DIAGNOSIS — S92335D Nondisplaced fracture of third metatarsal bone, left foot, subsequent encounter for fracture with routine healing: Secondary | ICD-10-CM | POA: Diagnosis not present
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
## 2024-02-21 ENCOUNTER — Encounter: Payer: Self-pay | Admitting: Family Medicine

## 2024-02-21 ENCOUNTER — Ambulatory Visit (INDEPENDENT_AMBULATORY_CARE_PROVIDER_SITE_OTHER): Payer: 59 | Admitting: Family Medicine

## 2024-02-21 VITALS — BP 107/60 | Ht 67.0 in | Wt 130.0 lb

## 2024-02-21 DIAGNOSIS — S92345D Nondisplaced fracture of fourth metatarsal bone, left foot, subsequent encounter for fracture with routine healing: Secondary | ICD-10-CM

## 2024-02-21 DIAGNOSIS — S92335D Nondisplaced fracture of third metatarsal bone, left foot, subsequent encounter for fracture with routine healing: Secondary | ICD-10-CM | POA: Diagnosis not present
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
## 2024-02-23 ENCOUNTER — Encounter: Payer: Self-pay | Admitting: Family Medicine

## 2024-02-23 ENCOUNTER — Ambulatory Visit (HOSPITAL_COMMUNITY)
Admission: RE | Admit: 2024-02-23 | Discharge: 2024-02-23 | Disposition: A | Discharge status: Home / Self Care | Payer: 59 | Source: Ambulatory Visit | Attending: Family Medicine | Admitting: Family Medicine

## 2024-02-23 DIAGNOSIS — S92335D Nondisplaced fracture of third metatarsal bone, left foot, subsequent encounter for fracture with routine healing: Secondary | ICD-10-CM | POA: Diagnosis present

## 2024-02-23 DIAGNOSIS — S92345D Nondisplaced fracture of fourth metatarsal bone, left foot, subsequent encounter for fracture with routine healing: Secondary | ICD-10-CM | POA: Diagnosis present

## 2024-03-05 ENCOUNTER — Other Ambulatory Visit: Payer: Self-pay

## 2024-03-05 DIAGNOSIS — M79672 Pain in left foot: Secondary | ICD-10-CM

## 2024-03-05 DIAGNOSIS — S92335D Nondisplaced fracture of third metatarsal bone, left foot, subsequent encounter for fracture with routine healing: Secondary | ICD-10-CM

## 2024-03-05 DIAGNOSIS — S92345D Nondisplaced fracture of fourth metatarsal bone, left foot, subsequent encounter for fracture with routine healing: Secondary | ICD-10-CM

## 2024-03-07 ENCOUNTER — Encounter: Payer: Self-pay | Admitting: Family Medicine

## 2024-03-08 ENCOUNTER — Encounter: Payer: Self-pay | Admitting: Family Medicine

## 2024-03-27 ENCOUNTER — Emergency Department (HOSPITAL_COMMUNITY)
Admission: EM | Admit: 2024-03-27 | Discharge: 2024-03-27 | Disposition: A | Discharge status: Home / Self Care | Attending: Emergency Medicine | Admitting: Emergency Medicine

## 2024-03-27 ENCOUNTER — Other Ambulatory Visit: Payer: Self-pay

## 2024-03-27 ENCOUNTER — Emergency Department (HOSPITAL_COMMUNITY)

## 2024-03-27 ENCOUNTER — Encounter (HOSPITAL_COMMUNITY): Payer: Self-pay | Admitting: Emergency Medicine

## 2024-03-27 DIAGNOSIS — K2900 Acute gastritis without bleeding: Secondary | ICD-10-CM | POA: Diagnosis not present

## 2024-03-27 DIAGNOSIS — R112 Nausea with vomiting, unspecified: Secondary | ICD-10-CM

## 2024-03-27 DIAGNOSIS — R079 Chest pain, unspecified: Secondary | ICD-10-CM

## 2024-03-27 LAB — LIPASE, BLOOD: Lipase: 29 U/L (ref 11–51)

## 2024-03-27 LAB — CBC WITH DIFFERENTIAL/PLATELET
Abs Immature Granulocytes: 0.02 10*3/uL (ref 0.00–0.07)
Basophils Absolute: 0 10*3/uL (ref 0.0–0.1)
Basophils Relative: 0 %
Eosinophils Absolute: 0 10*3/uL (ref 0.0–0.5)
Eosinophils Relative: 0 %
HCT: 39 % (ref 36.0–46.0)
Hemoglobin: 13.2 g/dL (ref 12.0–15.0)
Immature Granulocytes: 0 %
Lymphocytes Relative: 13 %
Lymphs Abs: 1 10*3/uL (ref 0.7–4.0)
MCH: 29.1 pg (ref 26.0–34.0)
MCHC: 33.8 g/dL (ref 30.0–36.0)
MCV: 86.1 fL (ref 80.0–100.0)
Monocytes Absolute: 0.5 10*3/uL (ref 0.1–1.0)
Monocytes Relative: 6 %
Neutro Abs: 6.3 10*3/uL (ref 1.7–7.7)
Neutrophils Relative %: 81 %
Platelets: 333 10*3/uL (ref 150–400)
RBC: 4.53 MIL/uL (ref 3.87–5.11)
RDW: 12 % (ref 11.5–15.5)
WBC: 7.9 10*3/uL (ref 4.0–10.5)
nRBC: 0 % (ref 0.0–0.2)

## 2024-03-27 LAB — HEMOGLOBIN A1C
Hgb A1c MFr Bld: 4.8 % (ref 4.8–5.6)
Mean Plasma Glucose: 91.06 mg/dL

## 2024-03-27 LAB — D-DIMER, QUANTITATIVE: D-Dimer, Quant: <0.27 ug{FEU}/mL (ref 0.00–0.50)

## 2024-03-27 LAB — LIPID PANEL
Cholesterol: 162 mg/dL (ref 0–200)
HDL: 55 mg/dL (ref >40)
LDL Cholesterol: 96 mg/dL (ref 0–99)
Total CHOL/HDL Ratio: 2.9 ratio
Triglycerides: 57 mg/dL (ref <150)
VLDL: 11 mg/dL (ref 0–40)

## 2024-03-27 LAB — COMPREHENSIVE METABOLIC PANEL WITH GFR
ALT: 13 U/L (ref 0–44)
AST: 18 U/L (ref 15–41)
Albumin: 4.5 g/dL (ref 3.5–5.0)
Alkaline Phosphatase: 61 U/L (ref 38–126)
Anion gap: 17 — ABNORMAL HIGH (ref 5–15)
BUN: 15 mg/dL (ref 6–20)
CO2: 18 mmol/L — ABNORMAL LOW (ref 22–32)
Calcium: 9.7 mg/dL (ref 8.9–10.3)
Chloride: 100 mmol/L (ref 98–111)
Creatinine, Ser: 0.79 mg/dL (ref 0.44–1.00)
GFR, Estimated: >60 mL/min (ref >60)
Glucose, Bld: 82 mg/dL (ref 70–99)
Potassium: 3.4 mmol/L — ABNORMAL LOW (ref 3.5–5.1)
Sodium: 135 mmol/L (ref 135–145)
Total Bilirubin: 1.3 mg/dL — ABNORMAL HIGH (ref 0.0–1.2)
Total Protein: 7.9 g/dL (ref 6.5–8.1)

## 2024-03-27 LAB — I-STAT CG4 LACTIC ACID, ED: Lactic Acid, Venous: 0.9 mmol/L (ref 0.5–1.9)

## 2024-03-27 LAB — PROTIME-INR
INR: 1.2 (ref 0.8–1.2)
Prothrombin Time: 15.6 s — ABNORMAL HIGH (ref 11.4–15.2)

## 2024-03-27 LAB — APTT: aPTT: 27 s (ref 24–36)

## 2024-03-27 LAB — HCG, SERUM, QUALITATIVE: Preg, Serum: NEGATIVE

## 2024-03-27 LAB — TROPONIN I (HIGH SENSITIVITY)
Troponin I (High Sensitivity): <2 ng/L (ref <18)
Troponin I (High Sensitivity): 2 ng/L (ref <18)

## 2024-03-27 MED ORDER — ALUM & MAG HYDROXIDE-SIMETH 200-200-20 MG/5ML PO SUSP
30.0000 mL | Freq: Once | ORAL | Status: AC
  Administered 2024-03-27: 30 mL via ORAL
  Filled 2024-03-27: qty 30

## 2024-03-27 MED ORDER — NITROGLYCERIN 0.4 MG SL SUBL
0.4000 mg | SUBLINGUAL_TABLET | SUBLINGUAL | Status: DC | PRN
  Administered 2024-03-27: 0.4 mg via SUBLINGUAL
  Filled 2024-03-27: qty 1

## 2024-03-27 MED ORDER — SODIUM CHLORIDE 0.9% FLUSH: 3.0000 mL | INTRAVENOUS | Status: DC | PRN

## 2024-03-27 MED ORDER — ACETAMINOPHEN 325 MG PO TABS
650.0000 mg | ORAL_TABLET | Freq: Four times a day (QID) | ORAL | Status: DC | PRN
  Administered 2024-03-27: 650 mg via ORAL
  Filled 2024-03-27: qty 2

## 2024-03-27 MED ORDER — ONDANSETRON HCL 4 MG/2ML IJ SOLN
4.0000 mg | Freq: Once | INTRAMUSCULAR | Status: AC
  Administered 2024-03-27: 4 mg via INTRAVENOUS
  Filled 2024-03-27: qty 2

## 2024-03-27 MED ORDER — LACTATED RINGERS IV BOLUS
1000.0000 mL | Freq: Once | INTRAVENOUS | Status: AC
  Administered 2024-03-27: 1000 mL via INTRAVENOUS

## 2024-03-27 MED ORDER — SODIUM CHLORIDE 0.9% FLUSH: 3.0000 mL | Freq: Two times a day (BID) | INTRAVENOUS | Status: DC

## 2024-03-27 MED ORDER — LIDOCAINE VISCOUS HCL 2 % MT SOLN
15.0000 mL | Freq: Once | OROMUCOSAL | Status: AC
  Administered 2024-03-27: 15 mL via OROMUCOSAL
  Filled 2024-03-27: qty 15

## 2024-03-27 MED ORDER — ASPIRIN 81 MG PO CHEW
324.0000 mg | CHEWABLE_TABLET | Freq: Once | ORAL | Status: AC
  Administered 2024-03-27: 324 mg via ORAL

## 2024-03-27 MED ORDER — PANTOPRAZOLE SODIUM 20 MG PO TBEC: 40.0000 mg | DELAYED_RELEASE_TABLET | Freq: Every day | ORAL | 0 refills | Status: AC

## 2024-03-27 MED ORDER — ONDANSETRON 4 MG PO TBDP: 4.0000 mg | ORAL_TABLET | Freq: Three times a day (TID) | ORAL | 0 refills | Status: AC | PRN

## 2024-03-27 NOTE — ED Notes (Signed)
 Pt endorses a squeezing sensation in her chest that comes and goes that has been part of her symptoms since they began.

## 2024-03-27 NOTE — ED Triage Notes (Signed)
 Pt. Stated Chest pain for 2 days with N/V and stomach pain.
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
